# Patient Record
Sex: Male | Born: 2000 | Race: White | Hispanic: No | Marital: Single | Smoking: Never smoker
Health system: Southern US, Community
[De-identification: ages and names within clinical notes are randomized; demographics above are authoritative.]

## PROBLEM LIST (undated history)

## (undated) DIAGNOSIS — J189 Pneumonia, unspecified organism: Secondary | ICD-10-CM

## (undated) HISTORY — DX: Pneumonia, unspecified organism: J18.9

## (undated) HISTORY — PX: SPINE SURGERY: SHX786

---
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
## 2019-01-12 ENCOUNTER — Other Ambulatory Visit: Payer: Self-pay

## 2019-01-12 MED ORDER — ALBUTEROL SULFATE HFA 108 (90 BASE) MCG/ACT IN AERS: INHALATION_SPRAY | RESPIRATORY_TRACT | 2 refills | Status: AC

## 2019-09-04 ENCOUNTER — Other Ambulatory Visit: Payer: Self-pay

## 2019-09-07 ENCOUNTER — Ambulatory Visit (INDEPENDENT_AMBULATORY_CARE_PROVIDER_SITE_OTHER): Payer: BC Managed Care – PPO

## 2019-09-07 ENCOUNTER — Encounter: Payer: Self-pay | Admitting: Nurse Practitioner

## 2019-09-07 ENCOUNTER — Other Ambulatory Visit: Payer: Self-pay

## 2019-09-07 ENCOUNTER — Ambulatory Visit: Payer: BC Managed Care – PPO | Admitting: Nurse Practitioner

## 2019-09-07 VITALS — BP 116/72 | HR 74 | Temp 98.7°F | Resp 20 | Ht 70.23 in | Wt 263.0 lb

## 2019-09-07 DIAGNOSIS — M545 Low back pain, unspecified: Secondary | ICD-10-CM

## 2019-09-07 DIAGNOSIS — M79605 Pain in left leg: Secondary | ICD-10-CM

## 2019-09-07 MED ORDER — METHYLPREDNISOLONE ACETATE 80 MG/ML IJ SUSP
80.0000 mg | Freq: Once | INTRAMUSCULAR | Status: AC
  Administered 2019-09-07: 11:00:00 80 mg via INTRAMUSCULAR

## 2019-09-07 MED ORDER — PREDNISONE 10 MG (21) PO TBPK: ORAL_TABLET | ORAL | 0 refills | Status: DC

## 2019-09-07 MED ORDER — CYCLOBENZAPRINE HCL 10 MG PO TABS: 10.0000 mg | ORAL_TABLET | Freq: Every day | ORAL | 1 refills | Status: DC

## 2019-09-07 NOTE — Progress Notes (Signed)
   Subjective:    Patient ID: Jonathan Henderson, male    DOB: 04-Jun-2001, 19 y.o.   MRN: [id number]   Chief Complaint: Back Pain (lower - radiates down left leg)   HPI Patient comes in today c/o low back pain. Started in summer and has not improved. He takes aleve which does not help. Rates pain 8/10. Worsens when he works. 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 Rest helps pan some. The pain is constant and now is radiating down left leg. Has seen chiropractor and that did not help at all.   Review of Systems  Constitutional: Negative for diaphoresis.  Eyes: Negative for pain.  Respiratory: Negative for shortness of breath.   Cardiovascular: Negative for chest pain, palpitations and leg swelling.  Gastrointestinal: Negative for abdominal pain.  Endocrine: Negative for polydipsia.  Musculoskeletal: Positive for back pain. Negative for gait problem.  Skin: Negative for rash.  Neurological: Negative for dizziness, weakness and headaches.  Hematological: Does not bruise/bleed easily.  All other systems reviewed and are negative.      Objective:   Physical Exam Vitals and nursing note reviewed.  Constitutional:      Appearance: Normal appearance. He is normal weight.  Cardiovascular:     Rate and Rhythm: Normal rate and regular rhythm.     Heart sounds: Normal heart sounds.  Pulmonary:     Breath sounds: Normal breath sounds.  Musculoskeletal:     Comments: Rises slowly from sitting to standing. FROM of lumbar spine with pain on rotation to left. (-) SLR in riht (+) SLR on left at 45 degrees Motor strength and sensation distally intact.  Skin:    General: Skin is warm.  Neurological:     Mental Status: He is alert and oriented to person, place, and time.     Cranial Nerves: No cranial nerve deficit.     Sensory: No sensory deficit.   BP 116/72   Pulse 74   Temp 98.7 F (37.1 C)   Resp 20   Ht 5' 10.23" (1.784 m)   Wt  263 lb (119.3 kg)   SpO2 98%   BMI 37.49 kg/m   Lumbar spine xray- normal-Preliminary reading by Paulene Floor, FNP  [facility]       Assessment & Plan:  [name] in today with chief complaint of Back Pain (lower - radiates down left leg)   1. Left leg pain - DG Lumbar Spine 2-3 Views; Future  2. Acute left-sided low back pain, unspecified whether sciatica present Moist eat rest - DG Lumbar Spine 2-3 Views; Future - methylPREDNISolone acetate (DEPO-MEDROL) injection 80 mg - predniSONE (STERAPRED UNI-PAK 21 TAB) 10 MG (21) TBPK tablet; As directed x 6 days  Dispense: 21 tablet; Refill: 0 - cyclobenzaprine (FLEXERIL) 10 MG tablet; Take 1 tablet (10 mg total) by mouth at bedtime.  Dispense: 30 tablet; Refill: 1    The above assessment and management plan was discussed with the patient. The patient verbalized understanding of and has agreed to the management plan. Patient is aware to call the clinic if symptoms persist or worsen. Patient is aware when to return to the clinic for a follow-up visit. Patient educated on when it is appropriate to go to the emergency department.   Jonathan Daphine Deutscher, FNP

## 2019-09-07 NOTE — Patient Instructions (Signed)
Acute Back Pain, Adult Acute back pain is sudden and usually short-lived. It is often caused by an injury to the muscles and tissues in the back. The injury may result from:  A muscle or ligament getting overstretched or torn (strained). Ligaments are tissues that connect bones to each other. Lifting something improperly can cause a back strain.  Wear and tear (degeneration) of the spinal disks. Spinal disks are circular tissue that provides cushioning between the bones of the spine (vertebrae).  Twisting motions, such as while playing sports or doing yard work.  A hit to the back.  Arthritis. You may have a physical exam, lab tests, and imaging tests to find the cause of your pain. Acute back pain usually goes away with rest and home care. Follow these instructions at home: Managing pain, stiffness, and swelling  Take over-the-counter and prescription medicines only as told by your health care provider.  Your health care provider may recommend applying ice during the first 24-48 hours after your pain starts. To do this: ? Put ice in a plastic bag. ? Place a towel between your skin and the bag. ? Leave the ice on for 20 minutes, 2-3 times a day.  If directed, apply heat to the affected area as often as told by your health care provider. Use the heat source that your health care provider recommends, such as a moist heat pack or a heating pad. ? Place a towel between your skin and the heat source. ? Leave the heat on for 20-30 minutes. ? Remove the heat if your skin turns bright red. This is especially important if you are unable to feel pain, heat, or cold. You have a greater risk of getting burned. Activity   Do not stay in bed. Staying in bed for more than 1-2 days can delay your recovery.  Sit up and stand up straight. Avoid leaning forward when you sit, or hunching over when you stand. ? If you work at a desk, sit close to it so you do not need to lean over. Keep your chin tucked  in. Keep your neck drawn back, and keep your elbows bent at a right angle. Your arms should look like the letter "L." ? Sit high and close to the steering wheel when you drive. Add lower back (lumbar) support to your car seat, if needed.  Take short walks on even surfaces as soon as you are able. Try to increase the length of time you walk each day.  Do not sit, drive, or stand in one place for more than 30 minutes at a time. Sitting or standing for long periods of time can put stress on your back.  Do not drive or use heavy machinery while taking prescription pain medicine.  Use proper lifting techniques. When you bend and lift, use positions that put less stress on your back: ? Bend your knees. ? Keep the load close to your body. ? Avoid twisting.  Exercise regularly as told by your health care provider. Exercising helps your back heal faster and helps prevent back injuries by keeping muscles strong and flexible.  Work with a physical therapist to make a safe exercise program, as recommended by your health care provider. Do any exercises as told by your physical therapist. Lifestyle  Maintain a healthy weight. Extra weight puts stress on your back and makes it difficult to have good posture.  Avoid activities or situations that make you feel anxious or stressed. Stress and anxiety increase muscle   tension and can make back pain worse. Learn ways to manage anxiety and stress, such as through exercise. General instructions  Sleep on a firm mattress in a comfortable position. Try lying on your side with your knees slightly bent. If you lie on your back, put a pillow under your knees.  Follow your treatment plan as told by your health care provider. This may include: ? Cognitive or behavioral therapy. ? Acupuncture or massage therapy. ? Meditation or yoga. Contact a health care provider if:  You have pain that is not relieved with rest or medicine.  You have increasing pain going down  into your legs or buttocks.  Your pain does not improve after 2 weeks.  You have pain at night.  You lose weight without trying.  You have a fever or chills. Get help right away if:  You develop new bowel or bladder control problems.  You have unusual weakness or numbness in your arms or legs.  You develop nausea or vomiting.  You develop abdominal pain.  You feel faint. Summary  Acute back pain is sudden and usually short-lived.  Use proper lifting techniques. When you bend and lift, use positions that put less stress on your back.  Take over-the-counter and prescription medicines and apply heat or ice as directed by your health care provider. This information is not intended to replace advice given to you by your health care provider. Make sure you discuss any questions you have with your health care provider. Document Revised: 10/28/2018 Document Reviewed: 02/20/2017 Elsevier Patient Education  2020 Elsevier Inc[employer]  

## 2019-09-16 ENCOUNTER — Telehealth: Payer: Self-pay | Admitting: Nurse Practitioner

## 2019-09-16 DIAGNOSIS — M545 Low back pain, unspecified: Secondary | ICD-10-CM

## 2019-09-16 NOTE — Telephone Encounter (Signed)
@  logo@ Incoming Patient Call  09/16/2019  What symptoms do you have? Back pain is still shooting down into left leg. Wants to set-up a MRI  How long have you been sick? 2-3- months  Have you been seen for this problem? 2-15 with [facility]  If your provider decides to give you a prescription, which pharmacy would you like for it to be sent to? [facility] in [facility]   Patient informed that this information will be sent to the clinical staff for review and that they should receive a follow up call.

## 2019-09-24 ENCOUNTER — Other Ambulatory Visit: Payer: Self-pay | Admitting: Nurse Practitioner

## 2019-11-20 ENCOUNTER — Other Ambulatory Visit: Payer: Self-pay | Admitting: Nurse Practitioner

## 2019-11-20 DIAGNOSIS — M545 Low back pain, unspecified: Secondary | ICD-10-CM

## 2019-11-20 NOTE — Progress Notes (Signed)
Mri ordered 

## 2019-12-04 ENCOUNTER — Telehealth: Payer: Self-pay | Admitting: Nurse Practitioner

## 2019-12-17 ENCOUNTER — Ambulatory Visit (HOSPITAL_COMMUNITY): Admission: RE | Admit: 2019-12-17 | Payer: BC Managed Care – PPO | Source: Ambulatory Visit

## 2019-12-19 ENCOUNTER — Other Ambulatory Visit: Payer: Self-pay

## 2019-12-19 ENCOUNTER — Ambulatory Visit
Admission: EM | Admit: 2019-12-19 | Discharge: 2019-12-19 | Disposition: A | Discharge status: Home / Self Care | Payer: BC Managed Care – PPO | Attending: Emergency Medicine | Admitting: Emergency Medicine

## 2019-12-19 DIAGNOSIS — L03316 Cellulitis of umbilicus: Secondary | ICD-10-CM | POA: Diagnosis not present

## 2019-12-19 MED ORDER — DOXYCYCLINE HYCLATE 100 MG PO CAPS
100.0000 mg | ORAL_CAPSULE | Freq: Two times a day (BID) | ORAL | 0 refills | Status: DC
Start: 2019-12-19 — End: 2020-11-25

## 2019-12-19 MED ORDER — MUPIROCIN 2 % EX OINT
1.0000 | TOPICAL_OINTMENT | Freq: Two times a day (BID) | CUTANEOUS | 1 refills | Status: DC
Start: 2019-12-19 — End: 2020-11-25

## 2019-12-19 NOTE — Discharge Instructions (Signed)
Wash site daily with warm water and mild soap Keep covered to avoid friction Mupirocin ointment prescribed.  Use as directed.   Take antibiotic as prescribed and to completion Follow up here or with PCP if symptoms persists Return or go to the ED if you have any new or worsening symptoms increased redness, swelling, pain, nausea, vomiting, fever, chills, etc..Marland Kitchen

## 2019-12-19 NOTE — ED Provider Notes (Signed)
[facility]   [id number] 12/19/19 Arrival Time: 1108   CC: Infection belly button  SUBJECTIVE:  Stanislaw Acton is a 19 y.o. male who presents with a possible infection of his belly button x 2 days. 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  Has not tried OTC medications.  Tender to the touch.  Reports previous symptoms in the past that resolved without intervention.  Complains of associated redness and purulent drainage. Denies fever, chills, nausea, vomiting, abdominal pain, changes in bowel or bladder habits.    ROS: As per HPI.  All other pertinent ROS negative.     Past Medical History:  Diagnosis Date  . Pneumonia    History reviewed. No pertinent surgical history. No Known Allergies No current facility-administered medications on file prior to encounter.   Current Outpatient Medications on File Prior to Encounter  Medication Sig Dispense Refill  . albuterol (VENTOLIN HFA) 108 (90 Base) MCG/ACT inhaler 2 puffs 20 minutes prior to exercise 18 g 2   Social History   Socioeconomic History  . Marital status: Single    Spouse name: Not on file  . Number of children: Not on file  . Years of education: Not on file  . Highest education level: Not on file  Occupational History  . Not on file  Tobacco Use  . Smoking status: Passive Smoke Exposure - Never Smoker  . Smokeless tobacco: Never Used  Substance and Sexual Activity  . Alcohol use: No  . Drug use: No  . Sexual activity: Not on file  Other Topics Concern  . Not on file  Social History Narrative  . Not on file   Social Determinants of Health   Financial Resource Strain:   . Difficulty of Paying Living Expenses:   Food Insecurity:   . Worried About Charity fundraiser in the Last Year:   . Arboriculturist in the Last Year:   Transportation Needs:   . Film/video editor (Medical):   Marland Kitchen Lack of Transportation (Non-Medical):     Physical Activity:   . Days of Exercise per Week:   . Minutes of Exercise per Session:   Stress:   . Feeling of Stress :   Social Connections:   . Frequency of Communication with Friends and Family:   . Frequency of Social Gatherings with Friends and Family:   . Attends Religious Services:   . Active Member of Clubs or Organizations:   . Attends Archivist Meetings:   Marland Kitchen Marital Status:   Intimate Partner Violence:   . Fear of Current or Ex-Partner:   . Emotionally Abused:   Marland Kitchen Physically Abused:   . Sexually Abused:    History reviewed. No pertinent family history.  OBJECTIVE:  Vitals:   12/19/19 1118  BP: 125/80  Pulse: 80  Resp: 18  Temp: 98.2 F (36.8 C)  SpO2: 98%     General appearance: alert; no distress Skin: possible abscess to umbilicus, ttp, purulent drainage present Psychological: alert and cooperative; normal mood and affect  ASSESSMENT & PLAN:  1. Cellulitis of umbilicus     Meds ordered this encounter  Medications  . doxycycline (VIBRAMYCIN) 100 MG capsule    Sig: Take 1 capsule (100 mg total) by mouth 2 (two) times daily.    Dispense:  20 capsule    Refill:  0    Order Specific Question:   Supervising Provider  AnswerEustace Moore [[id number]]  . mupirocin ointment (BACTROBAN) 2 %    Sig: Apply 1 application topically 2 (two) times daily.    Dispense:  30 g    Refill:  1    Order Specific Question:   Supervising Provider    Answer:   Eustace Moore [[id number]]   Wash site daily with warm water and mild soap Keep covered to avoid friction Mupirocin ointment prescribed.  Use as directed.   Take antibiotic as prescribed and to completion Follow up here or with PCP if symptoms persists Return or go to the ED if you have any new or worsening symptoms increased redness, swelling, pain, nausea, vomiting, fever, chills, etc...    Reviewed expectations re: course of current medical issues. Questions answered. Outlined signs  and symptoms indicating need for more acute intervention. Patient verbalized understanding. After Visit Summary given.          Rennis Harding, PA-C 12/19/19 1157

## 2019-12-19 NOTE — ED Triage Notes (Signed)
Pt is having some bleeding from umbilical area that stared 2 days ago, denies injury

## 2019-12-22 ENCOUNTER — Telehealth: Payer: Self-pay | Admitting: Nurse Practitioner

## 2019-12-22 NOTE — Telephone Encounter (Signed)
LM for Patient regarding this MRI- Patient was scheduled on 5/27 and was a no show to this appt.

## 2019-12-22 NOTE — Telephone Encounter (Signed)
  REFERRAL REQUEST Telephone Note 12/22/2019  What type of referral do you need? MRI of the back  Have you been seen at our office for this problem? Yes, by MMM (Advise that they may need an appointment with their PCP before a referral can be done)  Is there a particular doctor or location that you prefer? No  Patient notified that referrals can take up to a week or longer to process. If they haven't heard anything within a week they should call back and speak with the referral department.

## 2019-12-28 ENCOUNTER — Other Ambulatory Visit: Payer: Self-pay | Admitting: Nurse Practitioner

## 2019-12-28 DIAGNOSIS — M545 Low back pain, unspecified: Secondary | ICD-10-CM

## 2019-12-28 NOTE — Progress Notes (Signed)
Mri thorax ordered

## 2020-01-27 ENCOUNTER — Ambulatory Visit (HOSPITAL_COMMUNITY)
Admission: RE | Admit: 2020-01-27 | Discharge: 2020-01-27 | Disposition: A | Discharge status: Home / Self Care | Payer: BC Managed Care – PPO | Source: Ambulatory Visit | Attending: Nurse Practitioner | Admitting: Nurse Practitioner

## 2020-01-27 ENCOUNTER — Other Ambulatory Visit: Payer: Self-pay

## 2020-01-27 DIAGNOSIS — M545 Low back pain, unspecified: Secondary | ICD-10-CM

## 2020-01-27 MED ORDER — GADOBUTROL 1 MMOL/ML IV SOLN
10.0000 mL | Freq: Once | INTRAVENOUS | Status: AC | PRN
  Administered 2020-01-27: 10 mL via INTRAVENOUS

## 2020-02-10 ENCOUNTER — Other Ambulatory Visit: Payer: Self-pay

## 2020-02-10 ENCOUNTER — Ambulatory Visit (HOSPITAL_COMMUNITY)
Admission: RE | Admit: 2020-02-10 | Discharge: 2020-02-10 | Disposition: A | Discharge status: Home / Self Care | Payer: BC Managed Care – PPO | Source: Ambulatory Visit | Attending: Nurse Practitioner | Admitting: Nurse Practitioner

## 2020-02-10 DIAGNOSIS — M545 Low back pain, unspecified: Secondary | ICD-10-CM

## 2020-02-10 MED ORDER — GADOBUTROL 1 MMOL/ML IV SOLN
10.0000 mL | Freq: Once | INTRAVENOUS | Status: AC | PRN
  Administered 2020-02-10: 10 mL via INTRAVENOUS

## 2020-02-15 ENCOUNTER — Other Ambulatory Visit: Payer: Self-pay | Admitting: Nurse Practitioner

## 2020-02-15 DIAGNOSIS — M545 Low back pain, unspecified: Secondary | ICD-10-CM

## 2020-11-25 ENCOUNTER — Other Ambulatory Visit: Payer: Self-pay

## 2020-11-25 ENCOUNTER — Ambulatory Visit: Payer: BC Managed Care – PPO | Admitting: Nurse Practitioner

## 2020-11-25 ENCOUNTER — Encounter: Payer: Self-pay | Admitting: Nurse Practitioner

## 2020-11-25 VITALS — BP 121/75 | HR 64 | Temp 98.9°F | Resp 20 | Ht 70.0 in | Wt 241.0 lb

## 2020-11-25 DIAGNOSIS — L03316 Cellulitis of umbilicus: Secondary | ICD-10-CM

## 2020-11-25 MED ORDER — SULFAMETHOXAZOLE-TRIMETHOPRIM 800-160 MG PO TABS
1.0000 | ORAL_TABLET | Freq: Two times a day (BID) | ORAL | 0 refills | Status: DC
Start: 2020-11-25 — End: 2021-05-09

## 2020-11-25 NOTE — Progress Notes (Signed)
   Subjective:    Patient ID: Jonathan Henderson, male    DOB: 2001-05-03, 20 y.o.   MRN: [id number]   Chief Complaint: Raised area near naval   HPI Patient comes in with a lesion inside of his belly button. Has been draining and is sore to touch. This is the third time this has happened to him in the last couple f years.   Review of Systems  Constitutional: Negative for diaphoresis.  Eyes: Negative for pain.  Respiratory: Negative for shortness of breath.   Cardiovascular: Negative for chest pain, palpitations and leg swelling.  Gastrointestinal: Negative for abdominal pain.  Endocrine: Negative for polydipsia.  Skin: Negative for rash.  Neurological: Negative for dizziness, weakness and headaches.  Hematological: Does not bruise/bleed easily.  All other systems reviewed and are negative.      Objective:   Physical Exam Vitals and nursing note reviewed.  Constitutional:      Appearance: Normal appearance.  Cardiovascular:     Rate and Rhythm: Normal rate and regular rhythm.  Pulmonary:     Effort: Pulmonary effort is normal.     Breath sounds: Normal breath sounds.  Skin:    Comments: Foul smelling excudate from belly button.  Culture obtained.  Neurological:     Mental Status: He is alert.     BP 121/75   Pulse 64   Temp 98.9 F (37.2 C) (Temporal)   Resp 20   Ht 5\' 10"  (1.778 m)   Wt 241 lb (109.3 kg)   SpO2 99%   BMI 34.58 kg/m        Assessment & Plan:  Brandon Wiechman in today with chief complaint of Raised area near [name]   1. Cellulitis of umbilicus Keep clean and dry RTO if does not resolve - Anaerobic and Aerobic Culture    The above assessment and management plan was discussed with the patient. The patient verbalized understanding of and has agreed to the management plan. Patient is aware to call the clinic if symptoms persist or worsen. Patient is aware when to return to the clinic for a follow-up visit. Patient educated on when it is  appropriate to go to the emergency department.   Mary-Margaret [employer], FNP

## 2020-12-03 LAB — ANAEROBIC AND AEROBIC CULTURE

## 2021-03-18 IMAGING — MR MR LUMBAR SPINE WO/W CM
7 of 8 series · 26 of 48 positions shown · IV contrast (gadavist)
Comparison: Prior radiograph from 09/07/2019.

CLINICAL DATA: Initial evaluation for persistent acute left-sided
lower back pain.

EXAM:
MRI LUMBAR SPINE WITHOUT AND WITH CONTRAST
TECHNIQUE: Multiplanar and multiecho pulse sequences of the lumbar spine were
obtained without and with intravenous contrast.
CONTRAST:  10mL GADAVIST GADOBUTROL 1 MMOL/ML IV SOLN

[Series 5: T1 · sagittal · 4.0mm · 0.84mm/px · 2 of 16 slices shown (1 of 2)]
[im 1/16]
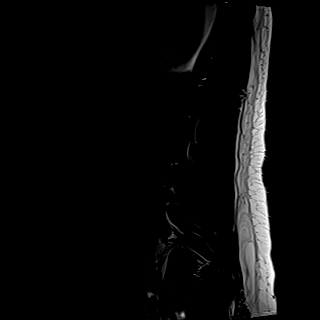
[im 16/16]
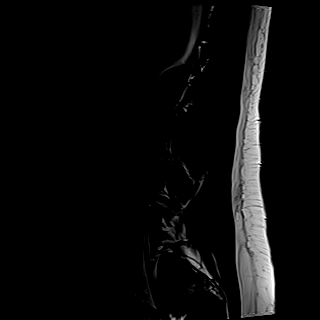

[Series 6: STIR · sagittal · 4.0mm · 0.53mm/px · 2 of 16 slices shown]
[im 1/16]
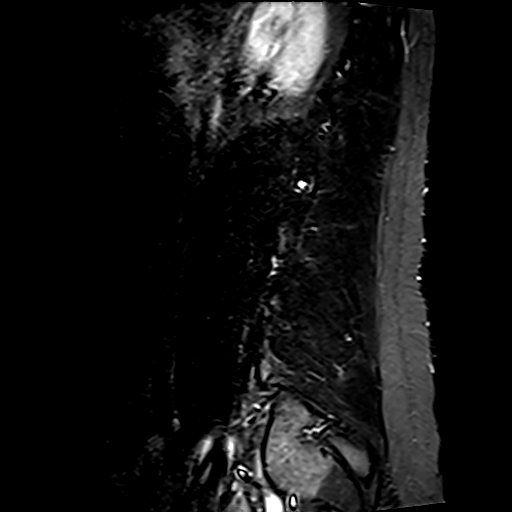
[im 16/16]
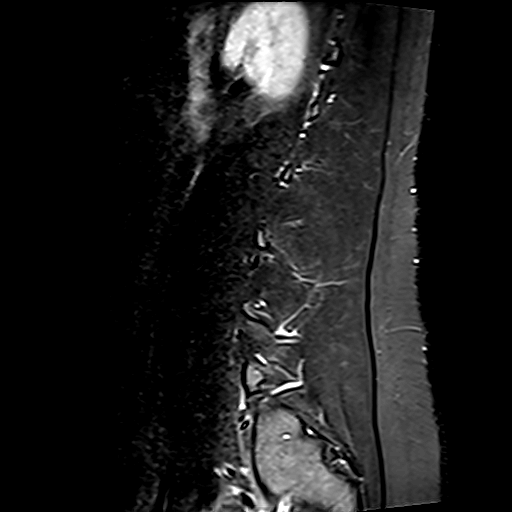

[Series 7: T2 · axial · 4.0mm · 0.62mm/px · z∈[-44,+211]mm · 7 of 44 slices shown]
[im 1/44]
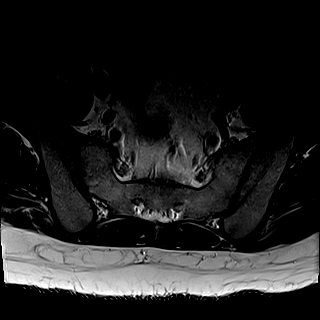
[im 8/44]
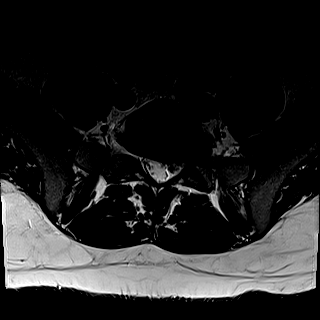
[im 15/44]
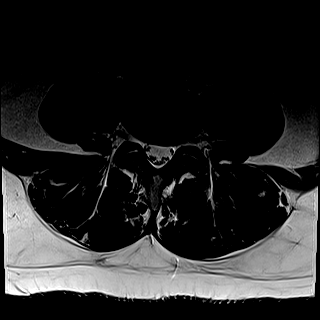
[im 22/44]
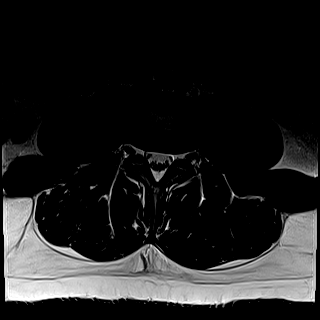
[im 29/44]
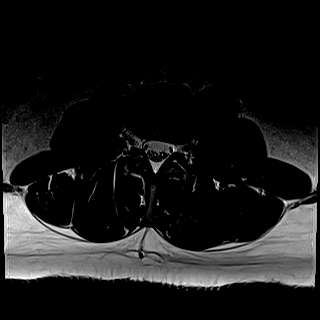
[im 36/44]
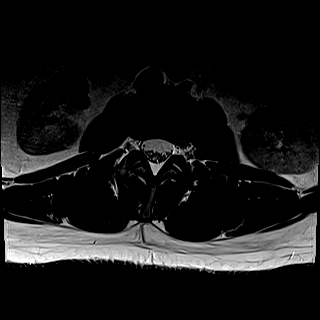
[im 44/44]
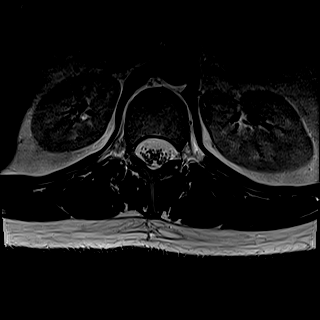

[Series 8: T1 · axial · 4.0mm · 0.39mm/px · z∈[-44,+211]mm · 7 of 44 slices shown (2 of 2)]
[im 1/44]
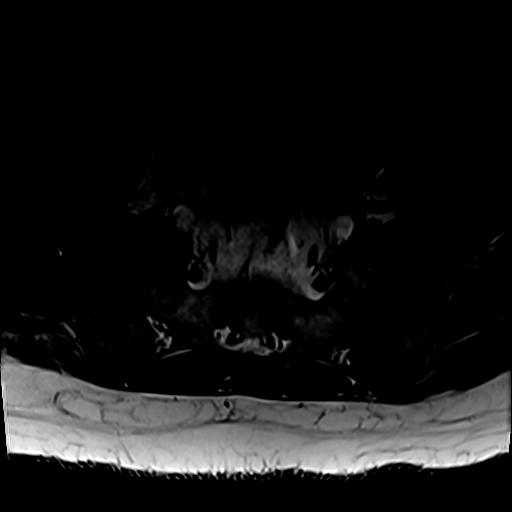
[im 8/44]
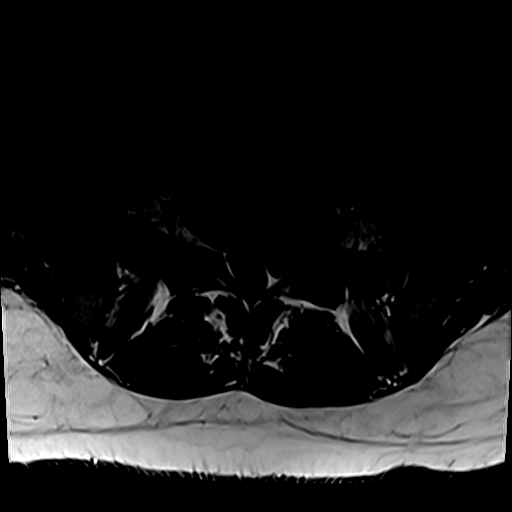
[im 15/44]
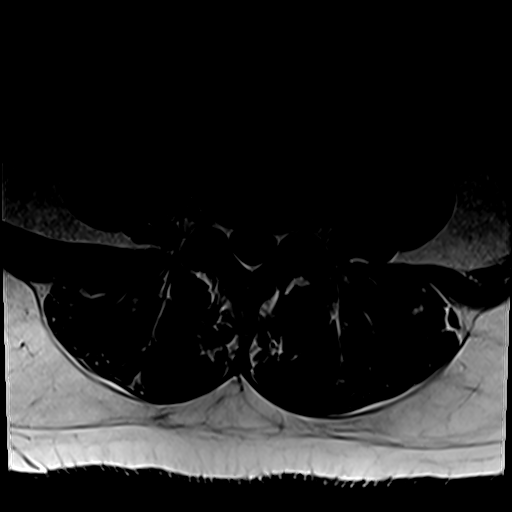
[im 22/44]
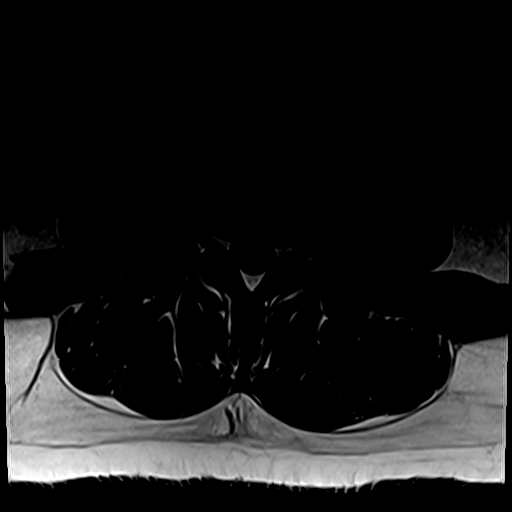
[im 29/44]
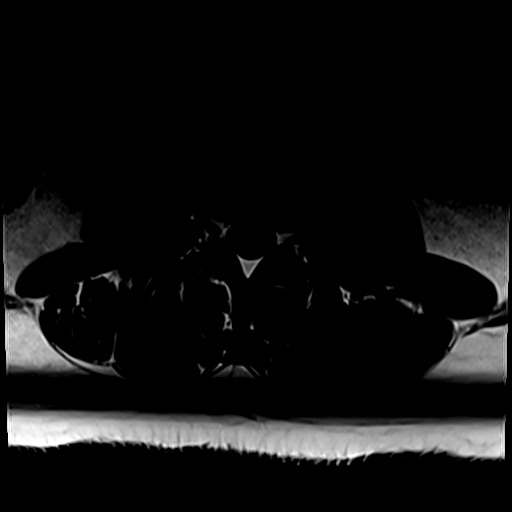
[im 36/44]
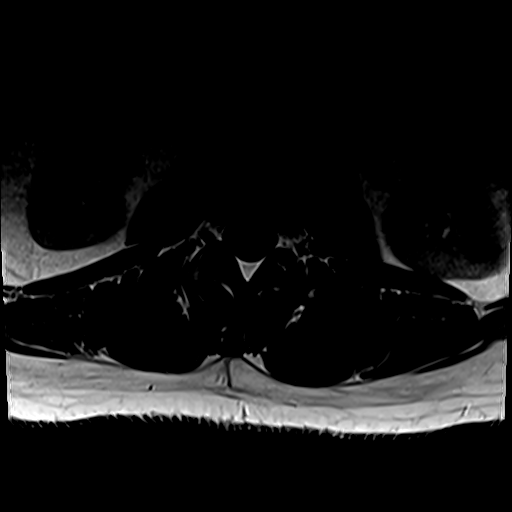
[im 44/44]
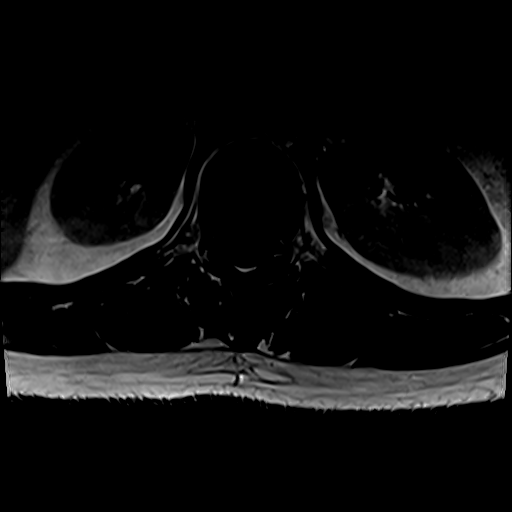

[Series 9: T2 post-contrast · sagittal · 4.0mm · 0.84mm/px · 2 of 16 slices shown]
[im 1/16]
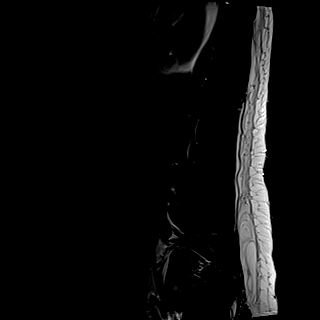
[im 16/16]
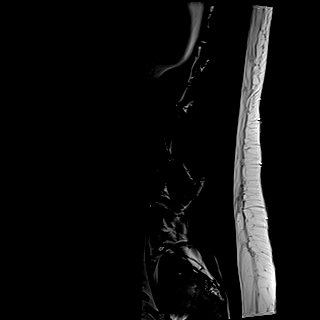

[Series 10: T1 fat-sat post-contrast · sagittal · 4.0mm · 0.81mm/px · 2 of 16 slices shown]
[im 1/16]
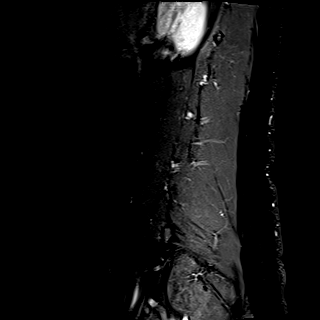
[im 16/16]
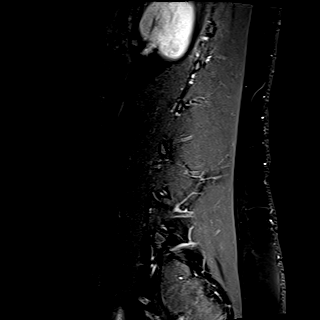

[Series 11: T1 post-contrast · axial · 4.0mm · 0.43mm/px · z∈[-45,+59]mm · 4 of 44 slices shown]
[im 1/44]
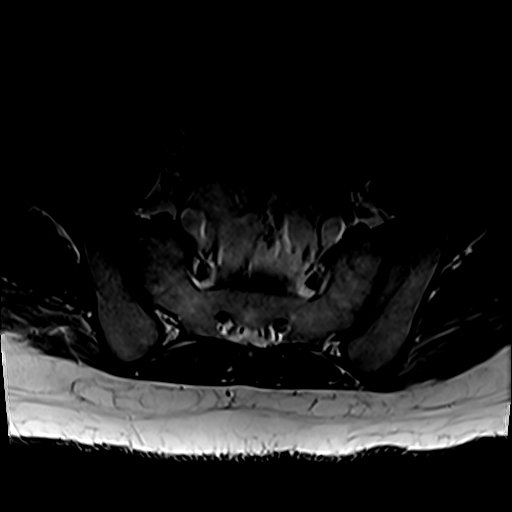
[im 8/44]
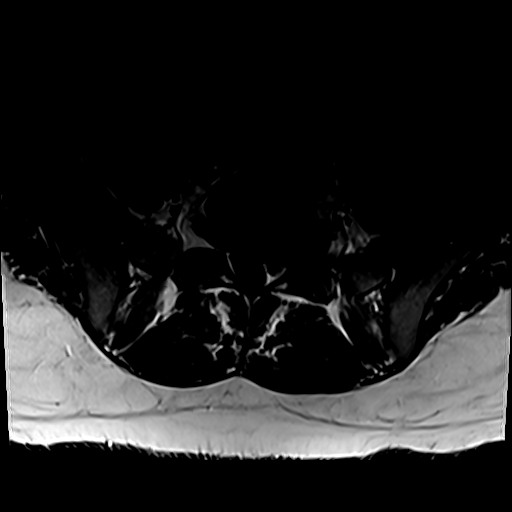
[im 15/44]
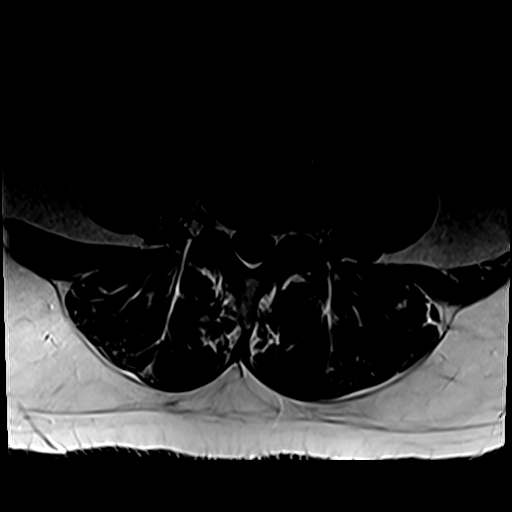
[im 22/44]
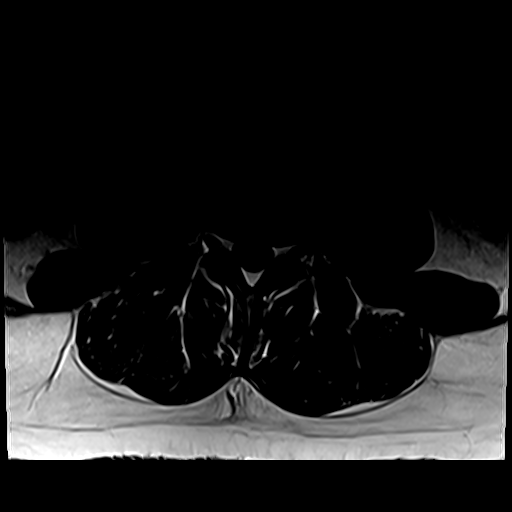

[26 of 48 positions shown; findings below may reference images not displayed]

FINDINGS: Segmentation: Standard. Lowest well-formed disc space labeled the
L5-S1 level.

Alignment: Physiologic with preservation of the normal lumbar
lordosis. No listhesis.

Vertebrae: Vertebral body height maintained without evidence for
acute or chronic fracture. Bone marrow signal intensity within
normal limits. No discrete or worrisome osseous lesions. No abnormal
marrow edema or enhancement.

Conus medullaris and cauda equina: Conus extends to the T12-L1
level. Conus and cauda equina appear normal.

Paraspinal and other soft tissues: Paraspinous soft tissues within
normal limits. Visualized visceral structures are normal.

Disc levels:

T11-12: Disc desiccation with intervertebral disc space narrowing
and mild reactive endplate changes. No significant stenosis.

T12-L1: Unremarkable.

L1-2:  Unremarkable.

L2-3:  Unremarkable.

L3-4: Normal interspace. Mild bilateral facet hypertrophy. No canal
or foraminal stenosis.

L4-5: Normal interspace. Mild bilateral facet hypertrophy. No canal
or foraminal stenosis.

L5-S1: Large left subarticular to foraminal disc protrusion is seen
(series 7, image 36). Disc material extends into the left lateral
recess, with lateral extension into the left neural foramen.
Associated Peri discal enhancement. Disc material contacts and
displaces the descending left S1 nerve root as it courses through
the left lateral recess. Disc material also contacts the exiting
left L5 nerve root as it courses through the left neural foramen.
Resultant moderate left foraminal stenosis with more severe
narrowing of the left lateral recess central canal remains patent.
No right foraminal encroachment.
IMPRESSION: 1. Large left subarticular to foraminal disc protrusion at L5-S1,
potentially affecting either the left L5 or descending S1 nerve
roots.
2. Mild bilateral facet hypertrophy at L3-4 and L4-5.

## 2021-05-09 ENCOUNTER — Encounter: Payer: Self-pay | Admitting: Nurse Practitioner

## 2021-05-09 ENCOUNTER — Telehealth (INDEPENDENT_AMBULATORY_CARE_PROVIDER_SITE_OTHER): Payer: BC Managed Care – PPO | Admitting: Nurse Practitioner

## 2021-05-09 DIAGNOSIS — L03316 Cellulitis of umbilicus: Secondary | ICD-10-CM

## 2021-05-09 MED ORDER — SULFAMETHOXAZOLE-TRIMETHOPRIM 800-160 MG PO TABS
1.0000 | ORAL_TABLET | Freq: Two times a day (BID) | ORAL | 0 refills | Status: DC
Start: 2021-05-09 — End: 2021-06-13

## 2021-05-09 NOTE — Progress Notes (Signed)
   Virtual Visit via video Note   Due to COVID-19 pandemic this visit was conducted virtually. This visit type was conducted due to national recommendations for restrictions regarding the COVID-19 Pandemic (e.g. social distancing, sheltering in place) in an effort to limit this patient's exposure and mitigate transmission in our community. All issues noted in this document were discussed and addressed.  A physical exam was not performed with this format.  I connected with  Jonathan Henderson  on 05/09/21 at 2:25 by video and verified that I am speaking with the correct person using two identifiers. Jonathan Henderson is currently located at work and no one is currently with him during visit. The provider, [name], FNP is located in their office at time of visit.  I discussed the limitations, risks, security and privacy concerns of performing an evaluation and management service by video  and the availability of in person appointments. I also discussed with the patient that there may be a patient responsible charge related to this service. The patient expressed understanding and agreed to proceed.   History and Present Illness:   Chief Complaint: Cellulitis   HPI Patient has history of umbilical cellulitis. He says that his belly button is draining and has a foul smell agaon. [place] needs oral antibiotic to clear it up.   Review of Systems  Constitutional:  Negative for chills and fever.  HENT: Negative.    Respiratory: Negative.    Cardiovascular: Negative.   Neurological: Negative.   Psychiatric/Behavioral: Negative.        Observations/Objective: Alert and oriented- answers all questions appropriately No distress Umbilicus is erythematous with yellowish bloody drainage  Assessment and Plan: [name] in today with chief complaint of Cellulitis   1. Cellulitis, umbilical Clean daily with antibacterial soap Dry well Do not pick at area  Meds ordered this  encounter  Medications   sulfamethoxazole-trimethoprim (BACTRIM DS) 800-160 MG tablet    Sig: Take 1 tablet by mouth 2 (two) times daily.    Dispense:  20 tablet    Refill:  0    Order Specific Question:   Supervising Provider    Answer:   Arville Care A [[id number]]       Follow Up Instructions: prn    I discussed the assessment and treatment plan with the patient. The patient was provided an opportunity to ask questions and all were answered. The patient agreed with the plan and demonstrated an understanding of the instructions.   The patient was advised to call back or seek an in-person evaluation if the symptoms worsen or if the condition fails to improve as anticipated.  The above assessment and management plan was discussed with the patient. The patient verbalized understanding of and has agreed to the management plan. Patient is aware to call the clinic if symptoms persist or worsen. Patient is aware when to return to the clinic for a follow-up visit. Patient educated on when it is appropriate to go to the emergency department.   Time call ended: 2:38  I provided 13 minutes of face-to-face time during this encounter.    [name], FNP

## 2021-05-16 ENCOUNTER — Telehealth: Payer: Self-pay | Admitting: Nurse Practitioner

## 2021-05-16 DIAGNOSIS — L03316 Cellulitis of umbilicus: Secondary | ICD-10-CM

## 2021-05-16 NOTE — Telephone Encounter (Signed)
Pt's grandma calling to let us know that he does want to proceed with the surgery that was discussed with Mary-Margaret. Please call back.

## 2021-05-16 NOTE — Telephone Encounter (Signed)
Attempted to return call - NA 

## 2021-05-16 NOTE — Telephone Encounter (Signed)
Are we talking about his belly button? Where would he like to go

## 2021-05-17 NOTE — Telephone Encounter (Signed)
Where does he want to go. [place]? [place]?

## 2021-05-17 NOTE — Telephone Encounter (Signed)
Yes,  pertaining to his belly button

## 2021-05-18 NOTE — Telephone Encounter (Signed)
Patient does not care which [place] you recommend he says.

## 2021-06-06 ENCOUNTER — Ambulatory Visit: Payer: BC Managed Care – PPO | Admitting: General Surgery

## 2021-06-13 ENCOUNTER — Encounter: Payer: Self-pay | Admitting: General Surgery

## 2021-06-13 ENCOUNTER — Ambulatory Visit: Payer: BC Managed Care – PPO | Admitting: General Surgery

## 2021-06-13 ENCOUNTER — Other Ambulatory Visit: Payer: Self-pay

## 2021-06-13 VITALS — BP 135/73 | HR 73 | Temp 98.7°F | Resp 14 | Ht 70.0 in | Wt 225.0 lb

## 2021-06-13 DIAGNOSIS — K429 Umbilical hernia without obstruction or gangrene: Secondary | ICD-10-CM

## 2021-06-14 NOTE — Progress Notes (Signed)
Jonathan Henderson; [id number]; Feb 25, 2001   HPI Patient is a 20 year old white male who was referred to my care by [name] for evaluation treatment of recurrent cellulitis of the umbilicus.  He states he has had intermittent episodes of infection in the base of his umbilicus for several years.  He is usually treated with cleaning the umbilicus and starting antibiotics.  He was most recently treated several weeks ago.  He currently has no drainage from his umbilicus. Past Medical History:  Diagnosis Date   Pneumonia     Past Surgical History:  Procedure Laterality Date   SPINE SURGERY      History reviewed. No pertinent family history.  Current Outpatient Medications on File Prior to Visit  Medication Sig Dispense Refill   albuterol (VENTOLIN HFA) 108 (90 Base) MCG/ACT inhaler 2 puffs 20 minutes prior to exercise (Patient not taking: Reported on 11/25/2020) 18 g 2   No current facility-administered medications on file prior to visit.    No Known Allergies  Social History   Substance and Sexual Activity  Alcohol Use No    Social History   Tobacco Use  Smoking Status Never   Passive exposure: Yes  Smokeless Tobacco Never    Review of Systems  Constitutional:  Positive for malaise/fatigue.  HENT: Negative.    Eyes: Negative.   Respiratory: Negative.    Cardiovascular: Negative.   Gastrointestinal: Negative.   Genitourinary: Negative.   Musculoskeletal: Negative.   Skin: Negative.   Neurological: Negative.   Endo/Heme/Allergies: Negative.   Psychiatric/Behavioral: Negative.     Objective   Vitals:   06/13/21 1449  BP: 135/73  Pulse: 73  Resp: 14  Temp: 98.7 F (37.1 C)  SpO2: 97%    Physical Exam Vitals reviewed.  Constitutional:      Appearance: Normal appearance. He is not ill-appearing.  HENT:     Head: Normocephalic and atraumatic.  Cardiovascular:     Rate and Rhythm: Normal rate and regular rhythm.     Heart sounds: Normal heart sounds.  No murmur heard.   No friction rub. No gallop.  Pulmonary:     Effort: Pulmonary effort is normal. No respiratory distress.     Breath sounds: Normal breath sounds. No stridor. No wheezing, rhonchi or rales.  Abdominal:     General: Bowel sounds are normal. There is no distension.     Palpations: Abdomen is soft. There is no mass.     Tenderness: There is no abdominal tenderness. There is no guarding or rebound.     Hernia: A hernia is present.     Comments: No draining wound from the base of the umbilicus.  The patient does have a small umbilical hernia.  No umbilical erythema is noted.  Skin:    General: Skin is warm and dry.  Neurological:     Mental Status: He is alert and oriented to person, place, and time.    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  I told him that sometimes the granulomatous/adipose tissue in the small hernia defect can cause recurrent cellulitis. Plan  I would suggest an umbilical herniorrhaphy with mesh.  I may have to resect some of the skin at the base of the umbilicus to complete closure.  The risks and benefits of the procedure including bleeding, infection, possible mesh use, and the possibility of recurrence of the hernia were fully explained to the patient, who gave informed consent.  He  will call to schedule surgery.

## 2021-06-26 ENCOUNTER — Encounter: Payer: Self-pay | Admitting: Nurse Practitioner

## 2021-06-26 ENCOUNTER — Ambulatory Visit (INDEPENDENT_AMBULATORY_CARE_PROVIDER_SITE_OTHER): Payer: BC Managed Care – PPO | Admitting: Nurse Practitioner

## 2021-06-26 ENCOUNTER — Ambulatory Visit (INDEPENDENT_AMBULATORY_CARE_PROVIDER_SITE_OTHER): Payer: BC Managed Care – PPO

## 2021-06-26 VITALS — BP 127/74 | HR 81 | Temp 98.7°F | Resp 20 | Ht 70.0 in | Wt 222.0 lb

## 2021-06-26 DIAGNOSIS — R1084 Generalized abdominal pain: Secondary | ICD-10-CM | POA: Diagnosis not present

## 2021-06-26 DIAGNOSIS — K2900 Acute gastritis without bleeding: Secondary | ICD-10-CM

## 2021-06-26 MED ORDER — LANSOPRAZOLE 30 MG PO CPDR
30.0000 mg | DELAYED_RELEASE_CAPSULE | Freq: Every day | ORAL | 1 refills | Status: DC
Start: 2021-06-26 — End: 2021-07-18

## 2021-06-26 NOTE — Patient Instructions (Signed)
Bland Diet A bland diet consists of foods that are often soft and do not have a lot of fat, fiber, or extra seasonings. Foods without fat, fiber, or seasoning are easier for the body to digest. They are also less likely to irritate your mouth, throat, stomach, and other parts of your digestive system. A bland dietis sometimes called a BRAT diet. What is my plan? Your health care provider or food and nutrition specialist (dietitian) may recommend specific changes to your diet to prevent symptoms or to treat your symptoms. These changes may include: Eating small meals often. Cooking food until it is soft enough to chew easily. Chewing your food well. Drinking fluids slowly. Not eating foods that are very spicy, sour, or fatty. Not eating citrus fruits, such as oranges and grapefruit. What do I need to know about this diet? Eat a variety of foods from the bland diet food list. Do not follow a bland diet longer than needed. Ask your health care provider whether you should take vitamins or supplements. What foods can I eat? Grains  Hot cereals, such as cream of wheat. Rice. Bread, crackers, or tortillas madefrom refined white flour. Vegetables Canned or cooked vegetables. Mashed or boiled potatoes. Fruits  Bananas. Applesauce. Other types of cooked or canned fruit with the skin andseeds removed, such as canned peaches or pears. Meats and other proteins  Scrambled eggs. Creamy peanut butter or other nut butters. Lean, well-cookedmeats, such as chicken or fish. Tofu. Soups or broths. Dairy Low-fat dairy products, such as milk, cottage cheese, or yogurt. Beverages  Water. Herbal tea. Apple juice. Fats and oils Mild salad dressings. Canola or olive oil. Sweets and desserts Pudding. Custard. Fruit gelatin. Ice cream. The items listed above may not be a complete list of recommended foods and beverages. Contact a dietitian for more options. What foods are not recommended? Grains Whole grain  breads and cereals. Vegetables Raw vegetables. Fruits Raw fruits, especially citrus, berries, or dried fruits. Dairy Whole fat dairy foods. Beverages Caffeinated drinks. Alcohol. Seasonings and condiments Strongly flavored seasonings or condiments. Hot sauce. Salsa. Other foods Spicy foods. Fried foods. Sour foods, such as pickled or fermented foods. Foodswith high sugar content. Foods high in fiber. The items listed above may not be a complete list of foods and beverages to avoid. Contact a dietitian for more information. Summary A bland diet consists of foods that are often soft and do not have a lot of fat, fiber, or extra seasonings. Foods without fat, fiber, or seasoning are easier for the body to digest. Check with your health care provider to see how long you should follow this diet plan. It is not meant to be followed for long periods. This information is not intended to replace advice given to you by your health care provider. Make sure you discuss any questions you have with your healthcare provider. Document Revised: 08/07/2017 Document Reviewed: 08/07/2017 Elsevier Patient Education  2022 [employer] Inc[employer]  

## 2021-06-26 NOTE — Progress Notes (Signed)
Subjective:    Patient ID: Jonathan Henderson, male    DOB: Jun 30, 2001, 20 y.o.   MRN: [id number]  Chief Complaint: feels nauseated every time he eats   HPI Patient come s in today stating that he feels nauseaous every time he eats. Has been losing weight. Has been going on for 6-8 weeks. Has abdominal pain is intermittent and is mid section. Pain is mostly after he eats. Occasioally before he eats. No vomiting but feels like he wants to. He has not been eating any spicy foods and is no better. Pain after eating is immediately. Has no appetite at all.   Wt Readings from Last 3 Encounters:  06/26/21 222 lb (100.7 kg)  06/13/21 225 lb (102.1 kg)  11/25/20 241 lb (109.3 kg)       Review of Systems  Constitutional: Negative.   HENT: Negative.    Gastrointestinal:  Positive for abdominal pain, constipation and nausea.  Musculoskeletal: Negative.   Psychiatric/Behavioral: Negative.        Objective:   Physical Exam Vitals and nursing note reviewed.  Constitutional:      Appearance: Normal appearance.  Cardiovascular:     Rate and Rhythm: Normal rate and regular rhythm.     Heart sounds: Normal heart sounds.  Pulmonary:     Effort: Pulmonary effort is normal.     Breath sounds: Normal breath sounds.  Abdominal:     General: Abdomen is flat. Bowel sounds are normal.     Palpations: Abdomen is soft. There is no mass.     Tenderness: There is no abdominal tenderness. There is no guarding or rebound (prevacid).     Hernia: No hernia is present.  Skin:    General: Skin is warm.  Neurological:     General: No focal deficit present.     Mental Status: He is alert and oriented to person, place, and time.  Psychiatric:        Mood and Affect: Mood normal.        Behavior: Behavior normal.   BP 127/74   Pulse 81   Temp 98.7 F (37.1 C) (Temporal)   Resp 20   Ht _0  (1.778 m)   Wt 222 lb (100.7 kg)   SpO2 100%   BMI 31.85 kg/m   KUB- moderate stool burden-Preliminary  reading by Ronnald Collum, FNP  [facility]       Assessment & Plan:   Jonathan Henderson in today with chief complaint of feels nauseated every time he eats   1. Generalized abdominal pain - DG Abd 1 View - CBC with Differential/Platelet - CMP14+EGFR  2. Other acute gastritis without hemorrhage First 24 Hours-Clear liquids  popsicles  Jello  gatorade  Sprite Second 24 hours-Add Full liquids ( Liquids you cant see through) Third 24 hours- Bland diet ( foods that are baked or broiled)  *avoiding fried foods and highly spiced foods* During these 3 days  Avoid milk, cheese, ice cream or any other dairy products  Avoid caffeine- REMEMBER Mt. Dew and Mello Yellow contain lots of caffeine Meds ordered this encounter  Medications   lansoprazole (PREVACID) 30 MG capsule    Sig: Take 1 capsule (30 mg total) by mouth daily at 12 noon.    Dispense:  30 capsule    Refill:  1    Order Specific Question:   Supervising Provider    Answer:   Caryl Pina A [[id number]]   Miralax for mild constipation. If no better by Monday-  call and will do GI referral   The above assessment and management plan was discussed with the patient. The patient verbalized understanding of and has agreed to the management plan. Patient is aware to call the clinic if symptoms persist or worsen. Patient is aware when to return to the clinic for a follow-up visit. Patient educated on when it is appropriate to go to the emergency department.   Mary-Margaret Hassell Done, FNP

## 2021-06-27 LAB — CMP14+EGFR
ALT: 25 IU/L (ref 0–44)
AST: 24 IU/L (ref 0–40)
Albumin/Globulin Ratio: 3.1 — ABNORMAL HIGH (ref 1.2–2.2)
Albumin: 5 g/dL (ref 4.1–5.2)
Alkaline Phosphatase: 72 IU/L (ref 44–121)
BUN/Creatinine Ratio: 11 (ref 9–20)
BUN: 14 mg/dL (ref 6–20)
Bilirubin Total: 0.5 mg/dL (ref 0.0–1.2)
CO2: 23 mmol/L (ref 20–29)
Calcium: 9.7 mg/dL (ref 8.7–10.2)
Chloride: 103 mmol/L (ref 96–106)
Creatinine, Ser: 1.26 mg/dL (ref 0.76–1.27)
Globulin, Total: 1.6 g/dL (ref 1.5–4.5)
Glucose: 83 mg/dL (ref 70–99)
Potassium: 4.2 mmol/L (ref 3.5–5.2)
Sodium: 141 mmol/L (ref 134–144)
Total Protein: 6.6 g/dL (ref 6.0–8.5)
eGFR: 83 mL/min/{1.73_m2} (ref >59)

## 2021-06-27 LAB — CBC WITH DIFFERENTIAL/PLATELET
Basophils Absolute: 0 10*3/uL (ref 0.0–0.2)
Basos: 0 %
EOS (ABSOLUTE): 0.1 10*3/uL (ref 0.0–0.4)
Eos: 1 %
Hematocrit: 44.6 % (ref 37.5–51.0)
Hemoglobin: 15.1 g/dL (ref 13.0–17.7)
Immature Grans (Abs): 0 10*3/uL (ref 0.0–0.1)
Immature Granulocytes: 0 %
Lymphocytes Absolute: 1.9 10*3/uL (ref 0.7–3.1)
Lymphs: 25 %
MCH: 29 pg (ref 26.6–33.0)
MCHC: 33.9 g/dL (ref 31.5–35.7)
MCV: 86 fL (ref 79–97)
Monocytes Absolute: 0.5 10*3/uL (ref 0.1–0.9)
Monocytes: 7 %
Neutrophils Absolute: 4.9 10*3/uL (ref 1.4–7.0)
Neutrophils: 67 %
Platelets: 236 10*3/uL (ref 150–450)
RBC: 5.2 x10E6/uL (ref 4.14–5.80)
RDW: 12.3 % (ref 11.6–15.4)
WBC: 7.4 10*3/uL (ref 3.4–10.8)

## 2021-07-18 ENCOUNTER — Other Ambulatory Visit: Payer: Self-pay | Admitting: Nurse Practitioner

## 2022-08-02 IMAGING — DX DG ABDOMEN 1V
2 series · 2 of 2 positions shown · non-contrast
Comparison: None.

CLINICAL DATA: Stomach pain.

EXAM:
ABDOMEN - 1 VIEW

[abdomen kub (1 of 2)]
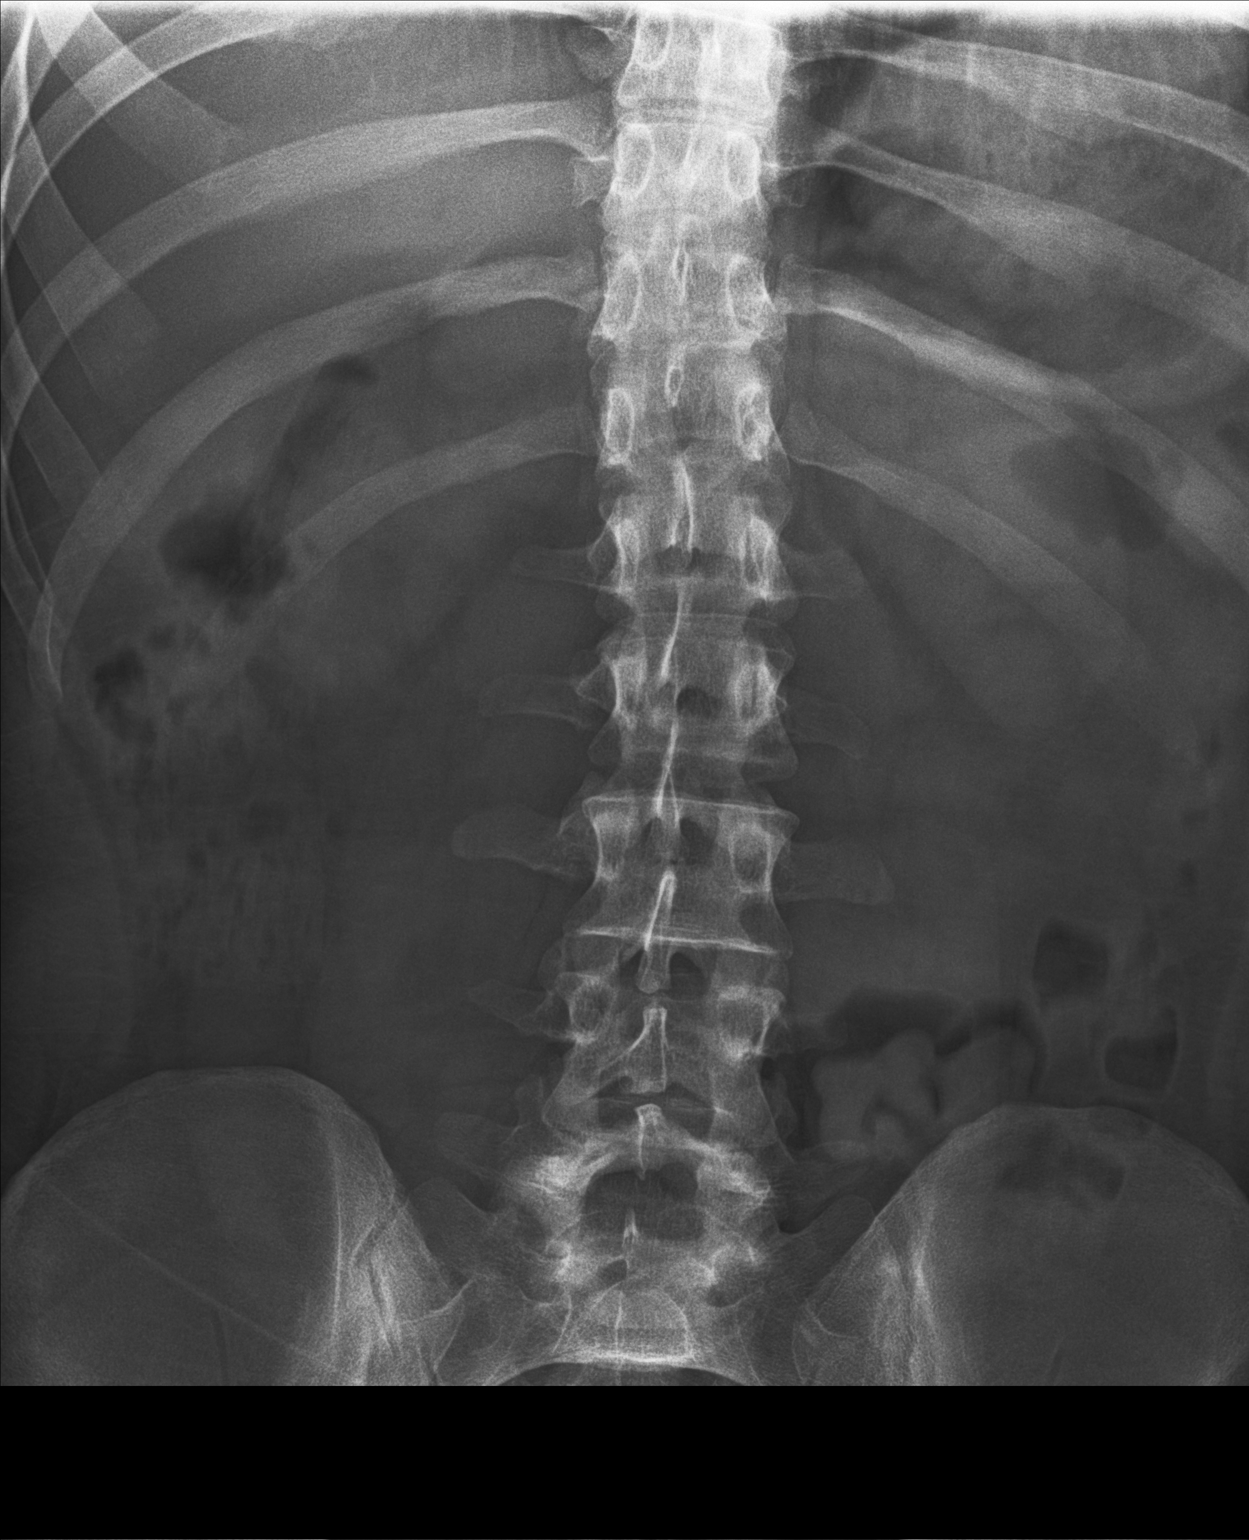

[abdomen kub (2 of 2)]
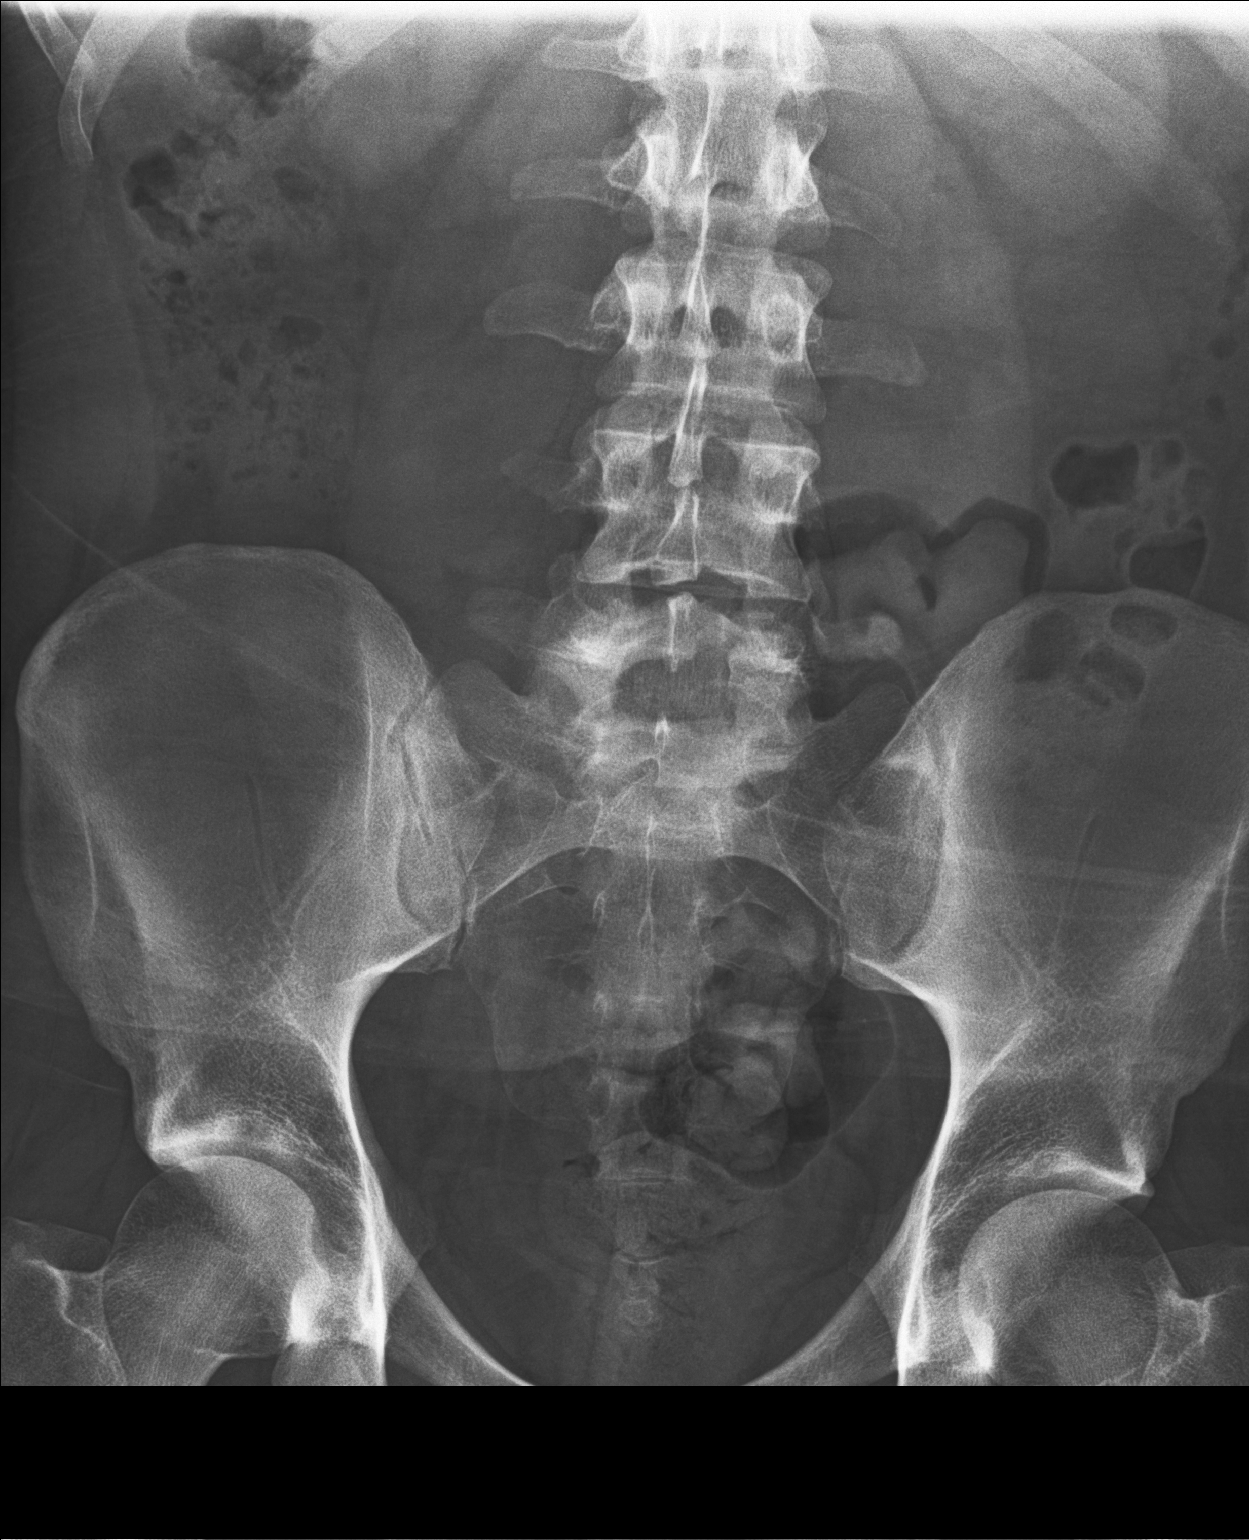

[2 of 2 positions shown; findings below may reference images not displayed]

FINDINGS: Normal bowel gas pattern. No bowel dilatation to suggest
obstruction. Small to moderate volume of colonic stool. No
visualized radiopaque calculi or abnormal soft tissue
calcifications. No concerning intraabdominal mass effect. No osseous
abnormalities are seen.
IMPRESSION: Unremarkable radiograph of the abdomen.

## 2024-08-14 ENCOUNTER — Other Ambulatory Visit: Payer: Self-pay | Admitting: Nurse Practitioner

## 2024-08-14 MED ORDER — SULFAMETHOXAZOLE-TRIMETHOPRIM 800-160 MG PO TABS: 1.0000 | ORAL_TABLET | Freq: Two times a day (BID) | ORAL | 0 refills | Status: AC
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
